# Patient Record
Sex: Male | Born: 2000 | Race: Black or African American | Hispanic: No | Marital: Single | Smoking: Never smoker
Health system: Southern US, Community
[De-identification: ages and names within clinical notes are randomized; demographics above are authoritative.]

---
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
## 2020-02-20 ENCOUNTER — Encounter (HOSPITAL_COMMUNITY): Payer: Self-pay | Admitting: Emergency Medicine

## 2020-02-20 ENCOUNTER — Ambulatory Visit (HOSPITAL_COMMUNITY)
Admission: EM | Admit: 2020-02-20 | Discharge: 2020-02-20 | Disposition: A | Discharge status: Home / Self Care | Payer: Self-pay | Attending: Urgent Care | Admitting: Urgent Care

## 2020-02-20 ENCOUNTER — Other Ambulatory Visit: Payer: Self-pay

## 2020-02-20 DIAGNOSIS — R300 Dysuria: Secondary | ICD-10-CM | POA: Insufficient documentation

## 2020-02-20 DIAGNOSIS — Z7251 High risk heterosexual behavior: Secondary | ICD-10-CM | POA: Insufficient documentation

## 2020-02-20 LAB — POCT URINALYSIS DIPSTICK, ED / UC
Bilirubin Urine: NEGATIVE
Glucose, UA: NEGATIVE mg/dL
Hgb urine dipstick: NEGATIVE
Ketones, ur: NEGATIVE mg/dL
Nitrite: NEGATIVE
Protein, ur: NEGATIVE mg/dL
Specific Gravity, Urine: 1.015 (ref 1.005–1.030)
Urobilinogen, UA: 0.2 mg/dL (ref 0.0–1.0)
pH: 7 (ref 5.0–8.0)

## 2020-02-20 MED ORDER — PHENAZOPYRIDINE HCL 200 MG PO TABS: 200.0000 mg | ORAL_TABLET | Freq: Three times a day (TID) | ORAL | 0 refills | Status: DC | PRN

## 2020-02-20 NOTE — ED Triage Notes (Signed)
Pt presents with dysuria xs 3 days. Requesting STD testing.

## 2020-02-20 NOTE — ED Provider Notes (Signed)
[facility]   MRN: [id number] DOB: Jan 13, 2000  Subjective:   Scott Boyer is a 20 y.o. male presenting for 3-day history of recurrent dysuria. Patient is sexually active, does not always use condoms for protection, has one male partner.  He admits that he regularly has this but mostly on days that he is drinking alcohol or the day after.  He also feels it when he is drinking a lot of soda and sweet tea.  He tries to hydrate with water but is really inconsistent with it.  Denies hematuria, urinary frequency, penile discharge, penile swelling, testicular pain, testicular swelling, anal pain, groin pain.  His girlfriend actually got tested for sexually transmitted infections and is negative.  She did test positive for an urinary tract infection.  Would like to make sure that he does not have this as well.    No current facility-administered medications for this encounter. No current outpatient medications on file.   No Known Allergies  History reviewed. No pertinent past medical history.   History reviewed. No pertinent surgical history.  Family History  Problem Relation Age of Onset  . Healthy Mother   . Cancer Father     Social History   Tobacco Use  . Smoking status: Never Smoker  . Smokeless tobacco: Never Used  Substance Use Topics  . Alcohol use: Yes  . Drug use: Yes    Types: Marijuana    ROS   Objective:   Vitals: BP (!) 121/58   Pulse 88   Temp 98.3 F (36.8 C) (Oral)   Resp 17   SpO2 96%   Physical Exam Constitutional:      General: He is not in acute distress.    Appearance: Normal appearance. He is well-developed and normal weight. He is not ill-appearing, toxic-appearing or diaphoretic.  HENT:     Head: Normocephalic and atraumatic.     Right Ear: External ear normal.     Left Ear: External ear normal.     Nose: Nose normal.     Mouth/Throat:     Pharynx: Oropharynx is clear.  Eyes:     General: No scleral icterus.        Right eye: No discharge.        Left eye: No discharge.     Extraocular Movements: Extraocular movements intact.     Pupils: Pupils are equal, round, and reactive to light.  Cardiovascular:     Rate and Rhythm: Normal rate.  Pulmonary:     Effort: Pulmonary effort is normal.  Genitourinary:    Penis: Circumcised. No phimosis, paraphimosis, hypospadias, erythema, tenderness, discharge, swelling or lesions.   Musculoskeletal:     Cervical back: Normal range of motion.  Neurological:     Mental Status: He is alert and oriented to person, place, and time.  Psychiatric:        Mood and Affect: Mood normal.        Behavior: Behavior normal.        Thought Content: Thought content normal.        Judgment: Judgment normal.     Results for orders placed or performed during the hospital encounter of 02/20/20 (from the past 24 hour(s))  POC Urinalysis dipstick     Status: Abnormal   Collection Time: 02/20/20 11:18 AM  Result Value Ref Range   Glucose, UA NEGATIVE NEGATIVE mg/dL   Bilirubin Urine NEGATIVE NEGATIVE   Ketones, ur NEGATIVE NEGATIVE mg/dL   Specific Gravity,  Urine 1.015 1.005 - 1.030   Hgb urine dipstick NEGATIVE NEGATIVE   pH 7.0 5.0 - 8.0   Protein, ur NEGATIVE NEGATIVE mg/dL   Urobilinogen, UA 0.2 0.0 - 1.0 mg/dL   Nitrite NEGATIVE NEGATIVE   Leukocytes,Ua SMALL (A) NEGATIVE     Assessment and Plan :   PDMP not reviewed this encounter.  1. Dysuria   2. Unprotected sex     Suspect patient has urinary irritation from not hydrating well with water and drinking multiple urinary irritants including alcohol, soda, fruit juices, etc.  Recommended that he stop drinking anything by plain water.  STI testing is pending.  In the meantime, we will have patient use Pyridium for symptomatic relief.  Counseled patient on potential for adverse effects with medications prescribed/recommended today, ER and return-to-clinic precautions discussed, patient verbalized understanding.     Wallis Bamberg, PA-C 02/20/20 1122

## 2020-02-20 NOTE — Discharge Instructions (Addendum)

## 2020-02-21 ENCOUNTER — Telehealth (HOSPITAL_COMMUNITY): Payer: Self-pay | Admitting: Emergency Medicine

## 2020-02-21 LAB — CYTOLOGY, (ORAL, ANAL, URETHRAL) ANCILLARY ONLY
Chlamydia: POSITIVE — AB
Comment: NEGATIVE
Comment: NEGATIVE
Comment: NORMAL
Neisseria Gonorrhea: NEGATIVE
Trichomonas: NEGATIVE

## 2020-02-21 MED ORDER — DOXYCYCLINE HYCLATE 100 MG PO CAPS
100.0000 mg | ORAL_CAPSULE | Freq: Two times a day (BID) | ORAL | 0 refills | Status: AC
Start: 2020-02-21 — End: 2020-02-28

## 2021-04-05 ENCOUNTER — Other Ambulatory Visit: Payer: Self-pay

## 2021-04-05 ENCOUNTER — Ambulatory Visit (HOSPITAL_COMMUNITY)
Admission: EM | Admit: 2021-04-05 | Discharge: 2021-04-05 | Disposition: A | Discharge status: Home / Self Care | Payer: Medicaid Other | Attending: Family Medicine | Admitting: Family Medicine

## 2021-04-05 ENCOUNTER — Encounter (HOSPITAL_COMMUNITY): Payer: Self-pay | Admitting: *Deleted

## 2021-04-05 DIAGNOSIS — N342 Other urethritis: Secondary | ICD-10-CM | POA: Insufficient documentation

## 2021-04-05 NOTE — ED Triage Notes (Signed)
Pt reports some heat in his penis.He wants to be checked for STD. ?

## 2021-04-05 NOTE — ED Provider Notes (Signed)
?[facility] ? ? ? ?CSN: [id number] ?Arrival date & time: 04/05/21  1406 ? ? ?  ? ?History   ?Chief Complaint ?Chief Complaint  ?Patient presents with  ? SEXUALLY TRANSMITTED DISEASE  ? ? ?HPI ?Scott Boyer is a 21 y.o. male.  ? ?HPI ?Here for a 2 to 3-week history of dysuria.  He also notes a few bumps that are very small on the shaft of his penis.  No ulcerations.  No fever or chills or abdominal pain. ?History reviewed. No pertinent past medical history. ? ?There are no problems to display for this patient. ? ? ?History reviewed. No pertinent surgical history. ? ? ? ? ?Home Medications   ? ?Prior to Admission medications   ?Medication Sig Start Date End Date Taking? Authorizing Provider  ?phenazopyridine (PYRIDIUM) 200 MG tablet Take 1 tablet (200 mg total) by mouth 3 (three) times daily as needed for pain. 02/20/20   Wallis Bamberg, PA-C  ? ? ?Family History ?Family History  ?Problem Relation Age of Onset  ? Healthy Mother   ? Cancer Father   ? ? ?Social History ?Social History  ? ?Tobacco Use  ? Smoking status: Never  ? Smokeless tobacco: Never  ?Substance Use Topics  ? Alcohol use: Yes  ? Drug use: Yes  ?  Types: Marijuana  ? ? ? ?Allergies   ?Patient has no known allergies. ? ? ?Review of Systems ?Review of Systems ? ? ?Physical Exam ?Triage Vital Signs ?ED Triage Vitals [04/05/21 1518]  ?Enc Vitals Group  ?   BP 122/60  ?   Pulse Rate 79  ?   Resp 18  ?   Temp 98.5 ?F (36.9 ?C)  ?   Temp src   ?   SpO2 100 %  ?   Weight   ?   Height   ?   Head Circumference   ?   Peak Flow   ?   Pain Score 0  ?   Pain Loc   ?   Pain Edu?   ?   Excl. in GC?   ? ?No data found. ? ?Updated Vital Signs ?BP 122/60   Pulse 79   Temp 98.5 ?F (36.9 ?C)   Resp 18   SpO2 100%  ? ?Visual Acuity ?Right Eye Distance:   ?Left Eye Distance:   ?Bilateral Distance:   ? ?Right Eye Near:   ?Left Eye Near:    ?Bilateral Near:    ? ?Physical Exam ?Vitals reviewed.  ?Constitutional:   ?   General: He is not in acute distress. ?    Appearance: He is not toxic-appearing.  ?HENT:  ?   Mouth/Throat:  ?   Mouth: Mucous membranes are moist.  ?Eyes:  ?   Extraocular Movements: Extraocular movements intact.  ?   Pupils: Pupils are equal, round, and reactive to light.  ?Cardiovascular:  ?   Rate and Rhythm: Normal rate and regular rhythm.  ?Pulmonary:  ?   Effort: Pulmonary effort is normal.  ?   Breath sounds: Normal breath sounds.  ?Abdominal:  ?   Palpations: Abdomen is soft.  ?   Tenderness: There is no abdominal tenderness.  ?Genitourinary: ?   Comments: Penis is normal in appearance there are tiny little bumps that are nonerythematous on the dorsum of his penis but do not appear to be pathologic ?Skin: ?   Capillary Refill: Capillary refill takes less than 2 seconds.  ?Neurological:  ?   General:  No focal deficit present.  ?   Mental Status: He is oriented to person, place, and time.  ?Psychiatric:     ?   Behavior: Behavior normal.  ? ? ? ?UC Treatments / Results  ?Labs ?(all labs ordered are listed, but only abnormal results are displayed) ?Labs Reviewed  ?CYTOLOGY, (ORAL, ANAL, URETHRAL) ANCILLARY ONLY  ? ? ?EKG ? ? ?Radiology ?No results found. ? ?Procedures ?Procedures (including critical care time) ? ?Medications Ordered in UC ?Medications - No data to display ? ?Initial Impression / Assessment and Plan / UC Course  ?I have reviewed the triage vital signs and the nursing notes. ? ?Pertinent labs & imaging results that were available during my care of the patient were reviewed by me and considered in my medical decision making (see chart for details). ? ?  ? ?Staff will call him with any positives and treat per protocol. ?Final Clinical Impressions(s) / UC Diagnoses  ? ?Final diagnoses:  ?None  ? ?Discharge Instructions   ?None ?  ? ?ED Prescriptions   ?None ?  ? ?PDMP not reviewed this encounter. ?  ?Zenia Resides, MD ?04/05/21 1552 ? ?

## 2021-04-05 NOTE — Discharge Instructions (Addendum)
Staff to call you with any positive tests on the swab. ? ? ?

## 2021-04-06 LAB — CYTOLOGY, (ORAL, ANAL, URETHRAL) ANCILLARY ONLY
Chlamydia: NEGATIVE
Comment: NEGATIVE
Comment: NEGATIVE
Comment: NORMAL
Neisseria Gonorrhea: NEGATIVE
Trichomonas: NEGATIVE

## 2022-10-26 ENCOUNTER — Ambulatory Visit (HOSPITAL_COMMUNITY)
Admission: EM | Admit: 2022-10-26 | Discharge: 2022-10-26 | Disposition: A | Discharge status: Home / Self Care | Payer: Medicaid Other | Attending: Physician Assistant | Admitting: Physician Assistant

## 2022-10-26 ENCOUNTER — Encounter (HOSPITAL_COMMUNITY): Payer: Self-pay

## 2022-10-26 DIAGNOSIS — Z113 Encounter for screening for infections with a predominantly sexual mode of transmission: Secondary | ICD-10-CM | POA: Insufficient documentation

## 2022-10-26 NOTE — ED Provider Notes (Signed)
[facility]    CSN: [id number] Arrival date & time: 10/26/22  1518      History   Chief Complaint Chief Complaint  Patient presents with   SEXUALLY TRANSMITTED DISEASE    HPI Scott Boyer is a 22 y.o. male.   Pt request std screening.  Pt reports he has no symptoms.  Pt denies any known exposure.  No fever.  Pt declines RPR and HIv.       History reviewed. No pertinent past medical history.  There are no problems to display for this patient.   History reviewed. No pertinent surgical history.     Home Medications    Prior to Admission medications   Not on File    Family History Family History  Problem Relation Age of Onset   Healthy Mother    Cancer Father     Social History Social History   Tobacco Use   Smoking status: Never   Smokeless tobacco: Never  Substance Use Topics   Alcohol use: Yes   Drug use: Yes    Types: Marijuana     Allergies   Patient has no known allergies.   Review of Systems Review of Systems  All other systems reviewed and are negative.    Physical Exam Triage Vital Signs ED Triage Vitals  Encounter Vitals Group     BP 10/26/22 1534 122/60     Systolic BP Percentile --      Diastolic BP Percentile --      Pulse Rate 10/26/22 1534 97     Resp 10/26/22 1534 16     Temp 10/26/22 1534 98 F (36.7 C)     Temp Source 10/26/22 1534 Oral     SpO2 10/26/22 1534 98 %     Weight --      Height --      Head Circumference --      Peak Flow --      Pain Score 10/26/22 1538 0     Pain Loc --      Pain Education --      Exclude from Growth Chart --    No data found.  Updated Vital Signs BP 122/60 (BP Location: Left Arm)   Pulse 97   Temp 98 F (36.7 C) (Oral)   Resp 16   SpO2 98%   Visual Acuity Right Eye Distance:   Left Eye Distance:   Bilateral Distance:    Right Eye Near:   Left Eye Near:    Bilateral Near:     Physical Exam Vitals and nursing note reviewed.  Constitutional:       Appearance: He is well-developed.  HENT:     Head: Normocephalic.  Cardiovascular:     Rate and Rhythm: Normal rate.  Pulmonary:     Effort: Pulmonary effort is normal.  Musculoskeletal:     Cervical back: Normal range of motion.  Neurological:     Mental Status: He is alert and oriented to person, place, and time.  Psychiatric:        Mood and Affect: Mood normal.      UC Treatments / Results  Labs (all labs ordered are listed, but only abnormal results are displayed) Labs Reviewed - No data to display  EKG   Radiology No results found.  Procedures Procedures (including critical care time)  Medications Ordered in UC Medications - No data to display  Initial Impression / Assessment and Plan / UC Course  I have  reviewed the triage vital signs and the nursing notes.  Pertinent labs & imaging results that were available during my care of the patient were reviewed by me and considered in my medical decision making (see chart for details).     Std test pending  Final Clinical Impressions(s) / UC Diagnoses   Final diagnoses:  Screening examination for STD (sexually transmitted disease)   Discharge Instructions   None    ED Prescriptions   None    PDMP not reviewed this encounter. An After Visit Summary was printed and given to the patient.       Elson Areas, [place] 10/26/22 [phone]

## 2022-10-26 NOTE — ED Triage Notes (Signed)
Pt present to the office for sti-testing. Denies any symptoms at this time.

## 2022-10-27 LAB — CYTOLOGY, (ORAL, ANAL, URETHRAL) ANCILLARY ONLY
Chlamydia: NEGATIVE
Comment: NEGATIVE
Comment: NEGATIVE
Comment: NORMAL
Neisseria Gonorrhea: NEGATIVE
Trichomonas: NEGATIVE
